# Patient Record
Sex: Male | Born: 1990 | Race: White | Hispanic: No | Marital: Single | Smoking: Never smoker
Health system: Southern US, Community
[De-identification: ages and names within clinical notes are randomized; demographics above are authoritative.]

## PROBLEM LIST (undated history)

## (undated) HISTORY — PX: WISDOM TOOTH EXTRACTION: SHX21

---

## 2013-03-24 ENCOUNTER — Encounter: Payer: Self-pay | Admitting: Podiatry

## 2013-03-24 ENCOUNTER — Ambulatory Visit (INDEPENDENT_AMBULATORY_CARE_PROVIDER_SITE_OTHER): Payer: BC Managed Care – PPO

## 2013-03-24 ENCOUNTER — Ambulatory Visit (INDEPENDENT_AMBULATORY_CARE_PROVIDER_SITE_OTHER): Payer: BC Managed Care – PPO | Admitting: Podiatry

## 2013-03-24 VITALS — BP 112/71 | HR 70 | Resp 18

## 2013-03-24 DIAGNOSIS — M205X9 Other deformities of toe(s) (acquired), unspecified foot: Secondary | ICD-10-CM

## 2013-03-24 DIAGNOSIS — L600 Ingrowing nail: Secondary | ICD-10-CM

## 2013-03-24 DIAGNOSIS — R52 Pain, unspecified: Secondary | ICD-10-CM

## 2013-03-24 NOTE — Progress Notes (Signed)
° °  Subjective:    Patient ID: Wayne HellingKevin Simon, male    DOB: 1990-07-08, 23 y.o.   MRN: [id number]  HPI My big toes are tilted in and sore and tender and no burning or throbbing and been going on for about 5 years and they dont hurt with shoes     Review of Systems  All other systems reviewed and are negative.       Objective:   Physical Exam        Assessment & Plan:

## 2013-03-24 NOTE — Patient Instructions (Signed)

## 2013-03-24 NOTE — Progress Notes (Signed)
Subjective:     Patient ID: Wayne HellingKevin Tutterow, male   DOB: December 02, 1990, 23 y.o.   MRN: [id number]  HPI patient presents with mother stating that his big toes have rotation and he develops corn callus formation left over right foot on the big toe which become bothersome and also has chronic ingrown toenail left over right hallux border towards the second toe that he has to trim and has become infected several times   Review of Systems  All other systems reviewed and are negative.       Objective:   Physical Exam  Nursing note and vitals reviewed. Constitutional: He is oriented to person, place, and time.  Musculoskeletal: Normal range of motion.  Neurological: He is oriented to person, place, and time.  Skin: Skin is warm.   neurovascular status intact with significant malalignment of the hallux in the frontal plane left over right with keratotic lesion on the medial side incurvated nail order of the left over right hallux lateral border. 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 We will try padding for the medial side as they do not recommend rushing to surgery and today I recommended correction of the ingrown toenail left hallux 2 to previous infection. Patient wants the procedure and explained risk him and his mother and today I infiltrated 60 mg Xylocaine Marcaine mixture applied sterile prep and under sterile conditions remove the lateral border left hallux exposed matrix and applied chemical 3 applications phenol followed by alcohol lavaged and sterile dressing. Instructed on soaks

## 2014-03-25 ENCOUNTER — Ambulatory Visit (INDEPENDENT_AMBULATORY_CARE_PROVIDER_SITE_OTHER): Payer: BLUE CROSS/BLUE SHIELD | Admitting: Podiatry

## 2014-03-25 VITALS — BP 118/85 | HR 70 | Resp 18

## 2014-03-25 DIAGNOSIS — L74519 Primary focal hyperhidrosis, unspecified: Secondary | ICD-10-CM

## 2014-03-25 DIAGNOSIS — R61 Generalized hyperhidrosis: Secondary | ICD-10-CM

## 2014-03-26 ENCOUNTER — Encounter: Payer: Self-pay | Admitting: Podiatry

## 2014-03-26 NOTE — Progress Notes (Signed)
Patient ID: Coralyn HellingKevin Solorio, male   DOB: [birth year], 24 y.o.   MRN: [id number]  Subjective: 24 year old male presents the office they with his mother with complaints of bilateral sweaty feet. Patient states that he has had sweaty feet for several years which she is concerned about. He has tried over-the-counter deodorants for his feet without much resolution. He does state that at times there is an odor associated with the moisture. No other complaints at this time.  Objective: AAO 3, NAD DP/PT pulses palpable, CRT less than 3 seconds Protective sensation intact with Simms Weinstein monofilament, vibratory sensation intact, Achilles tendon reflex intact. At this time there is no evidence of tinea pedis or significant onychomycosis. There does appear to be a mild amount of moisture within the plantar aspects of the feet bilaterally. No open lesions or pre-ulcer lesions identified bilaterally. There is no areas of pinpoint bony tenderness or pain with vibratory sensation of bilateral lower extremities. No overlying edema, erythema, increased warmth. MMT 5/5, ROM WNL. No pain with calf compression, swelling, warmth, erythema.  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 -Recommended formadon  -Discussed shoe gear and socks modifications. -Discussed diluted vinegar soaks -Discussed baking soda other powders to use in his shoes. -Recommended continue with these treatments. If symptoms continue recommend follow-up with dermatology.

## 2014-03-27 DIAGNOSIS — M79673 Pain in unspecified foot: Secondary | ICD-10-CM

## 2017-08-28 ENCOUNTER — Other Ambulatory Visit: Payer: Self-pay | Admitting: Orthopedic Surgery

## 2017-08-28 DIAGNOSIS — R52 Pain, unspecified: Secondary | ICD-10-CM

## 2017-08-28 DIAGNOSIS — R531 Weakness: Secondary | ICD-10-CM

## 2017-09-02 ENCOUNTER — Ambulatory Visit
Admission: RE | Admit: 2017-09-02 | Discharge: 2017-09-02 | Disposition: A | Discharge status: Home / Self Care | Payer: Self-pay | Source: Ambulatory Visit | Attending: Orthopedic Surgery | Admitting: Orthopedic Surgery

## 2017-09-02 DIAGNOSIS — R52 Pain, unspecified: Secondary | ICD-10-CM

## 2017-09-02 DIAGNOSIS — R531 Weakness: Secondary | ICD-10-CM

## 2018-08-31 DIAGNOSIS — G545 Neuralgic amyotrophy: Secondary | ICD-10-CM | POA: Insufficient documentation

## 2019-09-21 DIAGNOSIS — S63641A Sprain of metacarpophalangeal joint of right thumb, initial encounter: Secondary | ICD-10-CM | POA: Insufficient documentation

## 2019-09-21 DIAGNOSIS — M79641 Pain in right hand: Secondary | ICD-10-CM | POA: Insufficient documentation

## 2019-09-27 ENCOUNTER — Other Ambulatory Visit: Payer: Self-pay | Admitting: Orthopedic Surgery

## 2019-09-27 DIAGNOSIS — S63641A Sprain of metacarpophalangeal joint of right thumb, initial encounter: Secondary | ICD-10-CM

## 2019-09-28 ENCOUNTER — Other Ambulatory Visit: Payer: Self-pay | Admitting: Orthopedic Surgery

## 2019-09-28 DIAGNOSIS — S63641A Sprain of metacarpophalangeal joint of right thumb, initial encounter: Secondary | ICD-10-CM

## 2019-10-19 ENCOUNTER — Ambulatory Visit
Admission: RE | Admit: 2019-10-19 | Discharge: 2019-10-19 | Disposition: A | Discharge status: Home / Self Care | Payer: BC Managed Care – PPO | Source: Ambulatory Visit | Attending: Orthopedic Surgery | Admitting: Orthopedic Surgery

## 2019-10-19 ENCOUNTER — Other Ambulatory Visit: Payer: Self-pay

## 2019-10-19 DIAGNOSIS — S63641A Sprain of metacarpophalangeal joint of right thumb, initial encounter: Secondary | ICD-10-CM

## 2019-10-19 MED ORDER — IOPAMIDOL (ISOVUE-M 200) INJECTION 41%
1.0000 mL | Freq: Once | INTRAMUSCULAR | Status: AC
  Administered 2019-10-19: 1 mL via INTRA_ARTICULAR

## 2019-11-23 ENCOUNTER — Other Ambulatory Visit: Payer: Self-pay | Admitting: Orthopedic Surgery

## 2020-01-20 ENCOUNTER — Other Ambulatory Visit: Payer: Self-pay

## 2020-01-20 ENCOUNTER — Encounter (HOSPITAL_BASED_OUTPATIENT_CLINIC_OR_DEPARTMENT_OTHER): Payer: Self-pay | Admitting: Orthopedic Surgery

## 2020-01-23 ENCOUNTER — Other Ambulatory Visit (HOSPITAL_COMMUNITY)
Admission: RE | Admit: 2020-01-23 | Discharge: 2020-01-23 | Disposition: A | Discharge status: Home / Self Care | Payer: BC Managed Care – PPO | Source: Ambulatory Visit | Attending: Orthopedic Surgery | Admitting: Orthopedic Surgery

## 2020-01-23 DIAGNOSIS — Z01812 Encounter for preprocedural laboratory examination: Secondary | ICD-10-CM | POA: Insufficient documentation

## 2020-01-23 DIAGNOSIS — Z20822 Contact with and (suspected) exposure to covid-19: Secondary | ICD-10-CM | POA: Insufficient documentation

## 2020-01-23 DIAGNOSIS — S63642A Sprain of metacarpophalangeal joint of left thumb, initial encounter: Secondary | ICD-10-CM | POA: Diagnosis not present

## 2020-01-23 LAB — SARS CORONAVIRUS 2 (TAT 6-24 HRS): SARS Coronavirus 2: NEGATIVE

## 2020-01-26 ENCOUNTER — Encounter (HOSPITAL_BASED_OUTPATIENT_CLINIC_OR_DEPARTMENT_OTHER)
Admission: RE | Disposition: A | Discharge status: Home / Self Care | Payer: Self-pay | Source: Home / Self Care | Attending: Orthopedic Surgery

## 2020-01-26 ENCOUNTER — Ambulatory Visit (HOSPITAL_BASED_OUTPATIENT_CLINIC_OR_DEPARTMENT_OTHER)
Admission: RE | Admit: 2020-01-26 | Discharge: 2020-01-26 | Disposition: A | Discharge status: Home / Self Care | Payer: BC Managed Care – PPO | Attending: Orthopedic Surgery | Admitting: Orthopedic Surgery

## 2020-01-26 ENCOUNTER — Ambulatory Visit (HOSPITAL_BASED_OUTPATIENT_CLINIC_OR_DEPARTMENT_OTHER): Payer: BC Managed Care – PPO | Admitting: Anesthesiology

## 2020-01-26 ENCOUNTER — Encounter (HOSPITAL_BASED_OUTPATIENT_CLINIC_OR_DEPARTMENT_OTHER): Payer: Self-pay | Admitting: Orthopedic Surgery

## 2020-01-26 ENCOUNTER — Other Ambulatory Visit: Payer: Self-pay

## 2020-01-26 DIAGNOSIS — S63642A Sprain of metacarpophalangeal joint of left thumb, initial encounter: Secondary | ICD-10-CM | POA: Insufficient documentation

## 2020-01-26 DIAGNOSIS — Z20822 Contact with and (suspected) exposure to covid-19: Secondary | ICD-10-CM | POA: Insufficient documentation

## 2020-01-26 HISTORY — PX: ULNAR COLLATERAL LIGAMENT REPAIR: SHX6159

## 2020-01-26 SURGERY — REPAIR, LIGAMENT, ULNAR COLLATERAL
Anesthesia: Monitor Anesthesia Care | Site: Hand | Laterality: Right

## 2020-01-26 MED ORDER — HYDROCODONE-ACETAMINOPHEN 5-325 MG PO TABS: 1.0000 | ORAL_TABLET | Freq: Four times a day (QID) | ORAL | 0 refills | Status: AC | PRN

## 2020-01-26 MED ORDER — MIDAZOLAM HCL 2 MG/2ML IJ SOLN
2.0000 mg | Freq: Once | INTRAMUSCULAR | Status: AC
  Administered 2020-01-26: 10:00:00 2 mg via INTRAVENOUS

## 2020-01-26 MED ORDER — FENTANYL CITRATE (PF) 100 MCG/2ML IJ SOLN
INTRAMUSCULAR | Status: AC
  Filled 2020-01-26: qty 2

## 2020-01-26 MED ORDER — ROPIVACAINE HCL 5 MG/ML IJ SOLN
INTRAMUSCULAR | Status: DC | PRN
  Administered 2020-01-26: 30 mL via PERINEURAL

## 2020-01-26 MED ORDER — ONDANSETRON HCL 4 MG/2ML IJ SOLN
INTRAMUSCULAR | Status: DC | PRN
  Administered 2020-01-26: 4 mg via INTRAVENOUS

## 2020-01-26 MED ORDER — OXYCODONE HCL 5 MG PO TABS
5.0000 mg | ORAL_TABLET | Freq: Once | ORAL | Status: DC | PRN
Start: 2020-01-26 — End: 2020-01-26

## 2020-01-26 MED ORDER — MIDAZOLAM HCL 2 MG/2ML IJ SOLN
INTRAMUSCULAR | Status: AC
  Filled 2020-01-26: qty 2

## 2020-01-26 MED ORDER — PROMETHAZINE HCL 25 MG/ML IJ SOLN
6.2500 mg | INTRAMUSCULAR | Status: DC | PRN
Start: 2020-01-26 — End: 2020-01-26

## 2020-01-26 MED ORDER — HYDROMORPHONE HCL 1 MG/ML IJ SOLN: 0.2500 mg | INTRAMUSCULAR | Status: DC | PRN

## 2020-01-26 MED ORDER — FENTANYL CITRATE (PF) 100 MCG/2ML IJ SOLN
100.0000 ug | Freq: Once | INTRAMUSCULAR | Status: AC
  Administered 2020-01-26: 10:00:00 100 ug via INTRAVENOUS

## 2020-01-26 MED ORDER — PROPOFOL 10 MG/ML IV BOLUS
INTRAVENOUS | Status: DC | PRN
  Administered 2020-01-26: 40 mg via INTRAVENOUS
  Administered 2020-01-26: 30 mg via INTRAVENOUS

## 2020-01-26 MED ORDER — PROPOFOL 10 MG/ML IV BOLUS
INTRAVENOUS | Status: AC
  Filled 2020-01-26: qty 20

## 2020-01-26 MED ORDER — CEFAZOLIN SODIUM-DEXTROSE 2-4 GM/100ML-% IV SOLN
2.0000 g | INTRAVENOUS | Status: AC
  Administered 2020-01-26: 11:00:00 2 g via INTRAVENOUS

## 2020-01-26 MED ORDER — LACTATED RINGERS IV SOLN: INTRAVENOUS | Status: DC

## 2020-01-26 MED ORDER — OXYCODONE HCL 5 MG/5ML PO SOLN: 5.0000 mg | Freq: Once | ORAL | Status: DC | PRN

## 2020-01-26 MED ORDER — CEFAZOLIN SODIUM-DEXTROSE 2-4 GM/100ML-% IV SOLN
INTRAVENOUS | Status: AC
  Filled 2020-01-26: qty 100

## 2020-01-26 MED ORDER — PROPOFOL 500 MG/50ML IV EMUL
INTRAVENOUS | Status: DC | PRN
  Administered 2020-01-26: 150 ug/kg/min via INTRAVENOUS

## 2020-01-26 MED ORDER — LIDOCAINE 2% (20 MG/ML) 5 ML SYRINGE
INTRAMUSCULAR | Status: DC | PRN
  Administered 2020-01-26: 40 mg via INTRAVENOUS

## 2020-01-26 SURGICAL SUPPLY — 43 items
ANCHOR SUT 1.45 SZ 1 SHORT (Anchor) ×3 IMPLANT
BLADE MINI RND TIP GREEN BEAV (BLADE) ×3 IMPLANT
BLADE SURG 15 STRL LF DISP TIS (BLADE) ×1 IMPLANT
BLADE SURG 15 STRL SS (BLADE) ×2
BNDG COHESIVE 3X5 TAN STRL LF (GAUZE/BANDAGES/DRESSINGS) ×3 IMPLANT
BNDG ESMARK 4X9 LF (GAUZE/BANDAGES/DRESSINGS) ×3 IMPLANT
BNDG GAUZE ELAST 4 BULKY (GAUZE/BANDAGES/DRESSINGS) ×3 IMPLANT
CHLORAPREP W/TINT 26 (MISCELLANEOUS) ×3 IMPLANT
CORD BIPOLAR FORCEPS 12FT (ELECTRODE) ×3 IMPLANT
COVER BACK TABLE 60X90IN (DRAPES) ×3 IMPLANT
COVER MAYO STAND STRL (DRAPES) ×3 IMPLANT
CUFF TOURN SGL QUICK 18X4 (TOURNIQUET CUFF) ×3 IMPLANT
DRAPE EXTREMITY T 121X128X90 (DISPOSABLE) ×3 IMPLANT
DRAPE OEC MINIVIEW 54X84 (DRAPES) ×3 IMPLANT
DRAPE SURG 17X23 STRL (DRAPES) ×3 IMPLANT
GAUZE SPONGE 4X4 12PLY STRL (GAUZE/BANDAGES/DRESSINGS) ×3 IMPLANT
GAUZE XEROFORM 1X8 LF (GAUZE/BANDAGES/DRESSINGS) ×3 IMPLANT
GLOVE BIO SURGEON STRL SZ 6.5 (GLOVE) ×2 IMPLANT
GLOVE BIO SURGEONS STRL SZ 6.5 (GLOVE) ×1
GLOVE BIOGEL PI IND STRL 8.5 (GLOVE) ×1 IMPLANT
GLOVE BIOGEL PI INDICATOR 8.5 (GLOVE) ×2
GLOVE SURG ORTHO 8.0 STRL STRW (GLOVE) ×3 IMPLANT
GLOVE SURG UNDER POLY LF SZ7 (GLOVE) ×3 IMPLANT
GOWN STRL REUS W/ TWL LRG LVL3 (GOWN DISPOSABLE) ×1 IMPLANT
GOWN STRL REUS W/TWL LRG LVL3 (GOWN DISPOSABLE) ×2
GOWN STRL REUS W/TWL XL LVL3 (GOWN DISPOSABLE) ×6 IMPLANT
NDL SAFETY ECLIPSE 18X1.5 (NEEDLE) ×1 IMPLANT
NEEDLE HYPO 18GX1.5 SHARP (NEEDLE) ×2
NS IRRIG 1000ML POUR BTL (IV SOLUTION) ×3 IMPLANT
PACK BASIN DAY SURGERY FS (CUSTOM PROCEDURE TRAY) ×3 IMPLANT
PAD CAST 3X4 CTTN HI CHSV (CAST SUPPLIES) ×1 IMPLANT
PADDING CAST ABS 4INX4YD NS (CAST SUPPLIES) ×2
PADDING CAST ABS COTTON 4X4 ST (CAST SUPPLIES) ×1 IMPLANT
PADDING CAST COTTON 3X4 STRL (CAST SUPPLIES) ×2
SLING ARM FOAM STRAP LRG (SOFTGOODS) ×3 IMPLANT
SPLINT PLASTER CAST XFAST 3X15 (CAST SUPPLIES) ×10 IMPLANT
SPLINT PLASTER XTRA FASTSET 3X (CAST SUPPLIES) ×20
STOCKINETTE 4X48 STRL (DRAPES) ×3 IMPLANT
SUT CHROMIC 4 0 RB 1X27 (SUTURE) ×3 IMPLANT
SUT ETHILON 4 0 PS 2 18 (SUTURE) ×3 IMPLANT
SUT MERSILENE 4 0 P 3 (SUTURE) ×3 IMPLANT
TOWEL GREEN STERILE FF (TOWEL DISPOSABLE) ×6 IMPLANT
UNDERPAD 30X36 HEAVY ABSORB (UNDERPADS AND DIAPERS) ×3 IMPLANT

## 2020-01-26 NOTE — Discharge Instructions (Addendum)
Post Anesthesia Home Care Instructions  Activity: Get plenty of rest for the remainder of the day. A responsible individual must stay with you for 24 hours following the procedure.  For the next 24 hours, DO NOT: -Drive a car -Advertising copywriter -Drink alcoholic beverages -Take any medication unless instructed by your physician -Make any legal decisions or sign important papers.  Meals: Start with liquid foods such as gelatin or soup. Progress to regular foods as tolerated. Avoid greasy, spicy, heavy foods. If nausea and/or vomiting occur, drink only clear liquids until the nausea and/or vomiting subsides. Call your physician if vomiting continues.  Special Instructions/Symptoms: Your throat may feel dry or sore from the anesthesia or the breathing tube placed in your throat during surgery. If this causes discomfort, gargle with warm salt water. The discomfort should disappear within 24 hours.  If you had a scopolamine patch placed behind your ear for the management of post- operative nausea and/or vomiting:  1. The medication in the patch is effective for 72 hours, after which it should be removed.  Wrap patch in a tissue and discard in the trash. Wash hands thoroughly with soap and water. 2. You may remove the patch earlier than 72 hours if you experience unpleasant side effects which may include dry mouth, dizziness or visual disturbances. 3. Avoid touching the patch. Wash your hands with soap and water after contact with the patch.    Regional Anesthesia Blocks  1. Numbness or the inability to move the "blocked" extremity may last from 3-48 hours after placement. The length of time depends on the medication injected and your individual response to the medication. If the numbness is not going away after 48 hours, call your surgeon.  2. The extremity that is blocked will need to be protected until the numbness is gone and the  Strength has returned. Because you cannot feel it, you  will need to take extra care to avoid injury. Because it may be weak, you may have difficulty moving it or using it. You may not know what position it is in without looking at it while the block is in effect.  3. For blocks in the legs and feet, returning to weight bearing and walking needs to be done carefully. You will need to wait until the numbness is entirely gone and the strength has returned. You should be able to move your leg and foot normally before you try and bear weight or walk. You will need someone to be with you when you first try to ensure you do not fall and possibly risk injury.  4. Bruising and tenderness at the needle site are common side effects and will resolve in a few days.  5. Persistent numbness or new problems with movement should be communicated to the surgeon or the [facility] [phone] [facility] [phone]).   Hand Center Instructions Hand Surgery  Wound Care: Keep your hand elevated above the level of your heart.  Do not allow it to dangle by your side.  Keep the dressing dry and do not remove it unless your doctor advises you to do so.  He will usually change it at the time of your post-op visit.  Moving your fingers is advised to stimulate circulation but will depend on the site of your surgery.  If you have a splint applied, your doctor will advise you regarding movement.  Activity: Do not drive or operate machinery today.  Rest today and then you  may return to your normal activity and work as indicated by your physician.  Diet:  Drink liquids today or eat a light diet.  You may resume a regular diet tomorrow.          General expectations: Pain for two to three days. Fingers may become slightly swollen.  Call your doctor if any of the following occur: Severe pain not relieved by pain medication. Elevated temperature. Dressing soaked with blood. Inability to move fingers. White or bluish color to  fingers. Excuse from Work/School  Please excuse Wayne Simon from work/school beginning today for 10 days.

## 2020-01-26 NOTE — Progress Notes (Signed)
Assisted Dr. Miller with right, ultrasound guided, supraclavicular block. Side rails up, monitors on throughout procedure. See vital signs in flow sheet. Tolerated Procedure well. 
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
## 2020-01-26 NOTE — Brief Op Note (Signed)
01/26/2020  11:34 AM  PATIENT:  Wayne Simon  29 y.o. male  PRE-OPERATIVE DIAGNOSIS:  ULNAR COLLATERAL LIGAMENT TEAR RIGHT THUMB METACARPAL PHALANGEAL JOINT  POST-OPERATIVE DIAGNOSIS:  ULNAR COLLATERAL LIGAMENT TEAR RIGHT THUMB METACARPAL PHALANGEAL JOINT  PROCEDURE:  Procedure(s): REPAIR ULNAR COLLATERATL LIGAMENT METATCARPAL PHALANGEAL JOINT RIGHT THUMB (Right)  SURGEON:  Surgeon(s) and Role:    * Cindee Salt, MD - Primary    * Betha Loa, MD  PHYSICIAN ASSISTANT:   ASSISTANTS: K Arabia Nylund,MD   ANESTHESIA:   regional and IV sedation  EBL:  5 mL   BLOOD ADMINISTERED:none  DRAINS: none   LOCAL MEDICATIONS USED:  NONE  SPECIMEN:  No Specimen  DISPOSITION OF SPECIMEN:  N/A  COUNTS:  YES  TOURNIQUET:   Total Tourniquet Time Documented: Upper Arm (laterality) - 37 minutes Total: Upper Arm (laterality) - 37 minutes   DICTATION: .Dragon Dictation  PLAN OF CARE: Discharge to home after PACU  PATIENT DISPOSITION:  PACU - hemodynamically stable.

## 2020-01-26 NOTE — Anesthesia Postprocedure Evaluation (Signed)
Anesthesia Post Note  Patient: Wayne Simon  Procedure(s) Performed: REPAIR ULNAR COLLATERATL LIGAMENT METATCARPAL PHALANGEAL JOINT RIGHT THUMB (Right Hand)     Patient location during evaluation: PACU Anesthesia Type: Regional Level of consciousness: awake and alert and oriented Pain management: pain level controlled Vital Signs Assessment: post-procedure vital signs reviewed and stable Respiratory status: spontaneous breathing, nonlabored ventilation and respiratory function stable Cardiovascular status: blood pressure returned to baseline Postop Assessment: no apparent nausea or vomiting Anesthetic complications: no   No complications documented.  Last Vitals:  Vitals:   01/26/20 1147 01/26/20 1200  BP: 121/83 132/69  Pulse: 81 84  Resp: 14 18  Temp:  36.6 C  SpO2: 97% 95%    Last Pain:  Vitals:   01/26/20 1200  TempSrc: Oral  PainSc: 0-No pain                 Kaylyn Layer

## 2020-01-26 NOTE — Transfer of Care (Signed)
Immediate Anesthesia Transfer of Care Note  Patient: Wayne Simon  Procedure(s) Performed: REPAIR ULNAR COLLATERATL LIGAMENT METATCARPAL PHALANGEAL JOINT RIGHT THUMB (Right Hand)  Patient Location: PACU  Anesthesia Type:MAC combined with regional for post-op pain  Level of Consciousness: awake, alert  and oriented  Airway & Oxygen Therapy: Patient Spontanous Breathing and Patient connected to nasal cannula oxygen  Post-op Assessment: Report given to RN and Post -op Vital signs reviewed and stable  Post vital signs: Reviewed and stable  Last Vitals:  Vitals Value Taken Time  BP    Temp    Pulse 81 01/26/20 1141  Resp 14 01/26/20 1141  SpO2 99 % 01/26/20 1141  Vitals shown include unvalidated device data.  Last Pain:  Vitals:   01/26/20 0859  TempSrc: Oral  PainSc: 0-No pain         Complications: No complications documented.

## 2020-01-26 NOTE — Op Note (Signed)
I assisted Surgeon(s) and Role:    * Cindee Salt, MD - Primary    * Betha Loa, MD on the Procedure(s): REPAIR ULNAR COLLATERATL LIGAMENT METATCARPAL PHALANGEAL JOINT RIGHT THUMB on 01/26/2020.  I provided assistance on this case as follows: retraction soft tissues, positioning thumb, placement anchor.  Electronically signed by: Betha Loa, MD Date: 01/26/2020 Time: 3:01 PM

## 2020-01-26 NOTE — Anesthesia Preprocedure Evaluation (Signed)
Anesthesia Evaluation  Patient identified by MRN, date of birth, ID band Patient awake    Reviewed: Allergy & Precautions, H&P , NPO status , Patient's Chart, lab work & pertinent test results  Airway Mallampati: II  TM Distance: >3 FB Neck ROM: Full    Dental no notable dental hx.    Pulmonary neg pulmonary ROS,    Pulmonary exam normal breath sounds clear to auscultation       Cardiovascular negative cardio ROS Normal cardiovascular exam Rhythm:Regular Rate:Normal     Neuro/Psych  Neuromuscular disease negative psych ROS   GI/Hepatic negative GI ROS, Neg liver ROS,   Endo/Other  negative endocrine ROS  Renal/GU negative Renal ROS  negative genitourinary   Musculoskeletal negative musculoskeletal ROS (+)   Abdominal   Peds negative pediatric ROS (+)  Hematology negative hematology ROS (+)   Anesthesia Other Findings   Reproductive/Obstetrics negative OB ROS                             Anesthesia Physical Anesthesia Plan  ASA: II  Anesthesia Plan: MAC and Regional   Post-op Pain Management:    Induction: Intravenous  PONV Risk Score and Plan: 1 and Ondansetron and Treatment may vary due to age or medical condition  Airway Management Planned: Simple Face Mask  Additional Equipment:   Intra-op Plan:   Post-operative Plan:   Informed Consent: I have reviewed the patients History and Physical, chart, labs and discussed the procedure including the risks, benefits and alternatives for the proposed anesthesia with the patient or authorized representative who has indicated his/her understanding and acceptance.     Dental advisory given  Plan Discussed with: CRNA  Anesthesia Plan Comments:         Anesthesia Quick Evaluation

## 2020-01-26 NOTE — Anesthesia Procedure Notes (Signed)
Anesthesia Regional Block: Supraclavicular block   Pre-Anesthetic Checklist: ,, timeout performed, Correct Patient, Correct Site, Correct Laterality, Correct Procedure, Correct Position, site marked, Risks and benefits discussed,  Surgical consent,  Pre-op evaluation,  At surgeon's request and post-op pain management  Laterality: Right  Prep: chloraprep       Needles:  Injection technique: Single-shot  Needle Type: Stimiplex     Needle Length: 9cm  Needle Gauge: 21     Additional Needles:   Procedures:,,,, ultrasound used (permanent image in chart),,,,  Narrative:  Start time: 01/26/2020 10:02 AM End time: 01/26/2020 10:07 AM Injection made incrementally with aspirations every 5 mL.  Performed by: Personally  Anesthesiologist: Lowella Curb, MD

## 2020-01-30 ENCOUNTER — Encounter (HOSPITAL_BASED_OUTPATIENT_CLINIC_OR_DEPARTMENT_OTHER): Payer: Self-pay | Admitting: Orthopedic Surgery

## 2021-12-17 IMAGING — XA DG FLUORO GUIDE NDL PLC/BX
4 series · 4 of 4 positions shown · IV contrast (multihance)
Comparison: none

CLINICAL DATA: Gamekeeper's thumb.

EXAM:
RIGHT THUMB METACARPOPHALANGEAL JOINT INJECTION UNDER FLUOROSCOPY
TECHNIQUE: An appropriate skin entrance site was determined. The site was
marked, prepped with Betadine, draped in the usual sterile fashion,
and infiltrated locally with 1% lidocaine. A 25 gauge needle was
advanced into the right thumb metacarpophalangeal joint via a
lateral approach. A mixture of 0.1 mL of MultiHance, 15 mL of
Isovue-M 200, and 5 mL of sterile saline was then injected with
adequate filling of the MCP joint fluoroscopically. 1 mL of this
contrast mixture was injected. No immediate complication.
FLUOROSCOPY TIME:  Fluoroscopy Time:  7 seconds
Radiation Exposure Index (if provided by the fluoroscopic device):
0.09 microGray*m^2
Number of Acquired Spot Images: 0

[Series 1: ortho standard · 1 of 1 slices shown (1 of 4)]
[im 1/1]
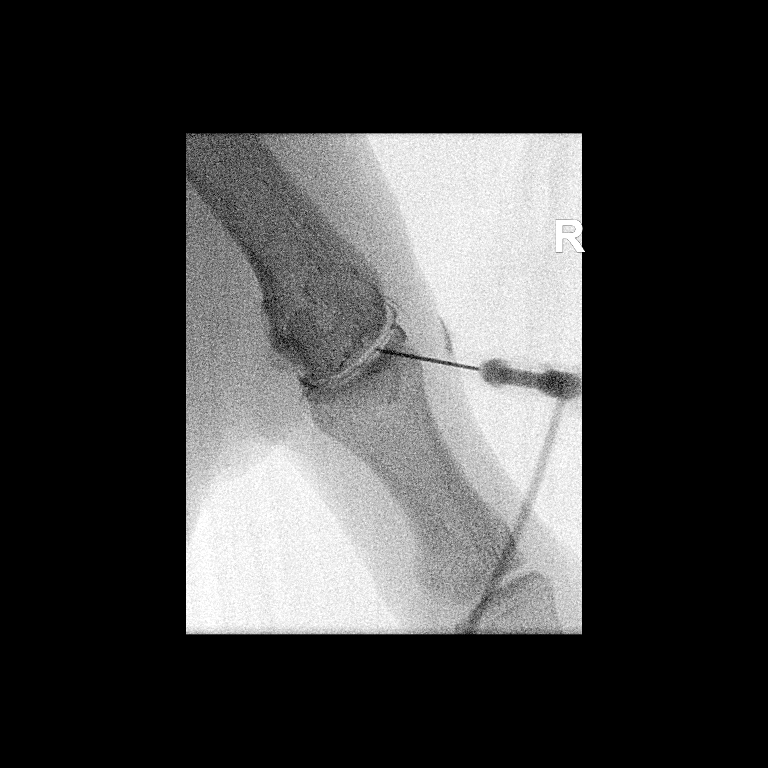

[Series 2: ortho standard · 1 of 1 slices shown (2 of 4)]
[im 1/1]
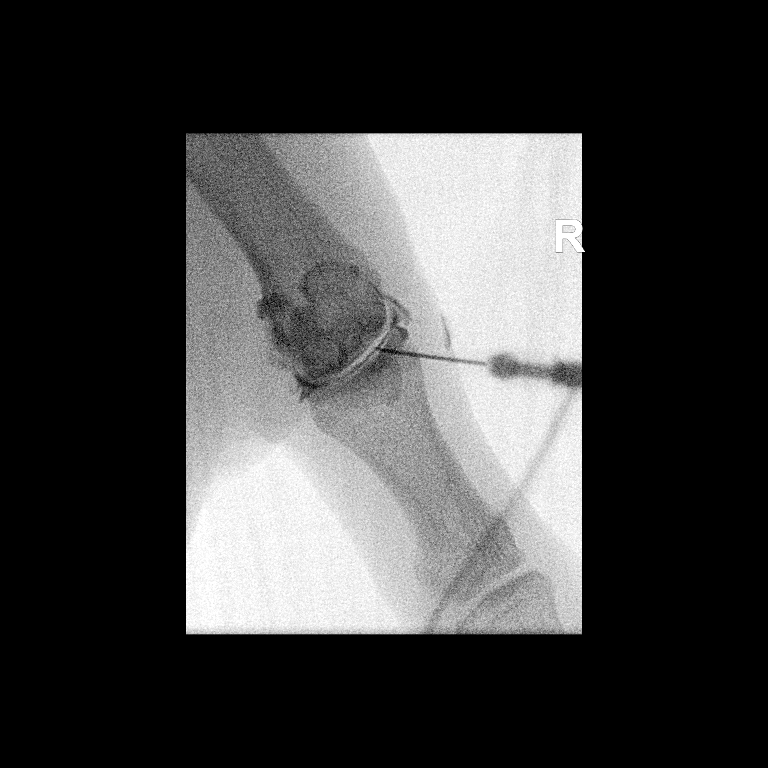

[Series 3: ortho standard · 1 of 1 slices shown (3 of 4)]
[im 1/1]
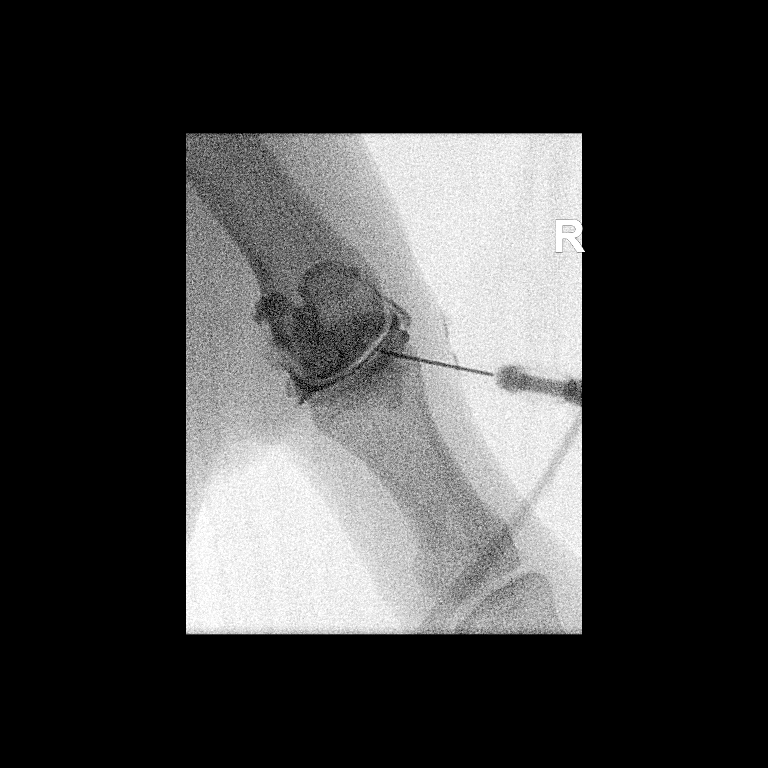

[Series 4: ortho standard · 1 of 1 slices shown (4 of 4)]
[im 1/1]
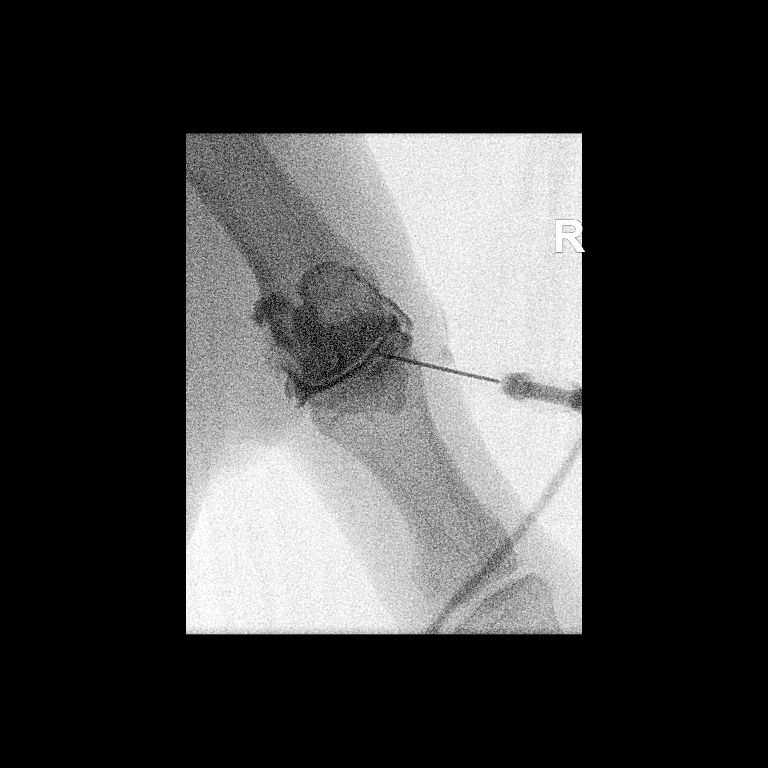

[4 of 4 positions shown; findings below may reference images not displayed]

IMPRESSION: Technically successful right thumb MCP joint injection for MRI.
